# Patient Record
Sex: Male | Born: 2000
Health system: Southern US, Community
[De-identification: ages and names within clinical notes are randomized; demographics above are authoritative.]

## PROBLEM LIST (undated history)

## (undated) HISTORY — PX: LYMPH NODE BIOPSY: SHX201

---
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
## 2023-12-25 ENCOUNTER — Encounter (HOSPITAL_BASED_OUTPATIENT_CLINIC_OR_DEPARTMENT_OTHER): Payer: Self-pay

## 2023-12-25 ENCOUNTER — Emergency Department (HOSPITAL_BASED_OUTPATIENT_CLINIC_OR_DEPARTMENT_OTHER): Admitting: Radiology

## 2023-12-25 ENCOUNTER — Emergency Department (HOSPITAL_BASED_OUTPATIENT_CLINIC_OR_DEPARTMENT_OTHER)
Admission: EM | Admit: 2023-12-25 | Discharge: 2023-12-25 | Disposition: A | Discharge status: Home / Self Care | Attending: Emergency Medicine | Admitting: Emergency Medicine

## 2023-12-25 ENCOUNTER — Other Ambulatory Visit: Payer: Self-pay

## 2023-12-25 DIAGNOSIS — J101 Influenza due to other identified influenza virus with other respiratory manifestations: Secondary | ICD-10-CM | POA: Insufficient documentation

## 2023-12-25 DIAGNOSIS — R059 Cough, unspecified: Secondary | ICD-10-CM | POA: Diagnosis present

## 2023-12-25 DIAGNOSIS — Z79899 Other long term (current) drug therapy: Secondary | ICD-10-CM | POA: Insufficient documentation

## 2023-12-25 LAB — RESP PANEL BY RT-PCR (RSV, FLU A&B, COVID)  RVPGX2
Influenza A by PCR: POSITIVE — AB
Influenza B by PCR: NEGATIVE
Resp Syncytial Virus by PCR: NEGATIVE
SARS Coronavirus 2 by RT PCR: NEGATIVE

## 2023-12-25 NOTE — ED Triage Notes (Addendum)
 Patient arrives POV with complaints of fever, cough, and congestion for 4-5 days. Patient has been seen at Urgent Care and his PCP and tested negative for Covid/Flu & strep. He is currently on antibiotics with minimal improvement. Concerned about his wet cough

## 2023-12-25 NOTE — ED Notes (Signed)
 Pt alert and oriented X 4 at the time of discharge. RR even and unlabored. No acute distress noted. Pt verbalized understanding of discharge instructions as discussed. Pt ambulatory to lobby at time of discharge.

## 2023-12-25 NOTE — Discharge Instructions (Signed)
 You tested positive for the flu today.  It takes about 5-7 days for symptoms to start improving. Your COVID, RSV are negative.  Your chest x-ray does not show any signs of pneumonia.  Your illness is contagious and can be spread to others. It cannot be cured by antibiotics or other medicines. Take basic precautions such as washing your hands often, covering your mouth when you cough or sneeze, and avoiding public places where you could spread your illness to others.  Home care instructions:  You can take Tylenol  (acetominophen) and/or Motrin  (Ibuprofen ) as directed on the packaging for fever reduction and pain relief.    For cough: honey 1/2 to 1 teaspoon (you can dilute the honey in water or another fluid).  You can also use guaifenesin and dextromethorphan for cough which are over-the-counter medications. You can use a humidifier for chest congestion and cough.  If you don't have a humidifier, you can sit in the bathroom with the hot shower running.      For sore throat: try warm salt water gargles, cepacol lozenges, throat spray, warm tea or water with lemon/honey, popsicles or ice, or OTC cold relief medicine for throat discomfort.    For congestion: Flonase 1-2 sprays in each nostril daily.    It is important to stay hydrated: drink plenty of fluids (water, gatorade/powerade/pedialyte, juices, or teas) to keep your throat moisturized and help further relieve irritation/discomfort.   You may return to normal activities (work/school) when:  - You are having improvement in symptoms  - AND have had resolution of fever without the use of fever-reducing medications for 24 hours  Follow-up instructions: Please follow-up with your primary care provider for further evaluation of your symptoms if you are not feeling better within the next 5 days.  Return instructions:  Please return to the Emergency Department if you experience worsening symptoms.  RETURN IMMEDIATELY IF you develop shortness of  breath, confusion or altered mental status, a new rash, become dizzy, faint, or poorly responsive, or are unable to be cared for at home. Please return if you have persistent vomiting and cannot keep down fluids or develop a fever that is not controlled by tylenol  or motrin .   Please return if you have any other emergent concerns.

## 2023-12-25 NOTE — ED Notes (Signed)
Patient back to room from imaging.

## 2023-12-25 NOTE — ED Provider Notes (Signed)
 [place] EMERGENCY DEPARTMENT AT [facility] Provider Note   CSN: [id number] Arrival date & time: 12/25/23  1122     Patient presents with: Cough and Fever   Keith Costa is a 23 y.o. male without significant past medical history presents with concern for a productive cough, congestion, fever, and bodyaches that started 5 days ago.  He reports he was seen by urgent care and his PCP for symptoms but had negative flu and COVID testing there as well as negative strep testing.  He was started on a course of azithromycin for his symptoms and is on the last day of this antibiotic.  He reports his symptoms have not improved significantly with this medication.  Denies any shortness of breath.  Denies any difficulties with swallowing nor sore throat currently.    Cough Associated symptoms: fever   Fever Associated symptoms: cough        Prior to Admission medications   Medication Sig Start Date End Date Taking? Authorizing Provider  methylPREDNISolone  (MEDROL  DOSEPAK) 4 MG TBPK tablet Take pills all together every day for 6 days. 6-5-4-3-2-1. Take the first dose as soon as possible. The rest take in the morning with breakfast. 08/28/20   Ines Onetha NOVAK, MD  rizatriptan  (MAXALT -MLT) 10 MG disintegrating tablet Take 1 tablet (10 mg total) by mouth as needed for migraine. Take at onset of cluster headaches. May repeat in 2 hours if needed 08/28/20   Ines Onetha NOVAK, MD    Allergies: Cefdinir and Omni-pac    Review of Systems  Constitutional:  Positive for fever.  Respiratory:  Positive for cough.     Updated Vital Signs BP 136/81   Pulse 80   Temp 99.2 F (37.3 C) (Oral)   Resp 16   Ht 6' 2 (1.88 m)   Wt 87.1 kg   SpO2 100%   BMI 24.65 kg/m   Physical Exam Vitals and nursing note reviewed.  Constitutional:      General: He is not in acute distress.    Appearance: He is well-developed.  HENT:     Head: Normocephalic and atraumatic.     Right Ear: Tympanic  membrane and ear canal normal.     Left Ear: Tympanic membrane and ear canal normal.     Mouth/Throat:     Pharynx: No oropharyngeal exudate or posterior oropharyngeal erythema.  Eyes:     Conjunctiva/sclera: Conjunctivae normal.  Neck:     Comments: Submandibular lymphadenopathy bilaterally Cardiovascular:     Rate and Rhythm: Normal rate and regular rhythm.     Heart sounds: No murmur heard. Pulmonary:     Effort: Pulmonary effort is normal. No respiratory distress.     Breath sounds: Normal breath sounds.  Abdominal:     Palpations: Abdomen is soft.     Tenderness: There is no abdominal tenderness.  Musculoskeletal:        General: No swelling.     Cervical back: Neck supple.  Skin:    General: Skin is warm and dry.     Capillary Refill: Capillary refill takes less than 2 seconds.  Neurological:     Mental Status: He is alert.  Psychiatric:        Mood and Affect: Mood normal.     (all labs ordered are listed, but only abnormal results are displayed) Labs Reviewed  RESP PANEL BY RT-PCR (RSV, FLU A&B, COVID)  [id number] - Abnormal; Notable for the following components:      Result Value  Influenza A by PCR POSITIVE (*)    All other components within normal limits    EKG: None  Radiology: DG Chest 2 View Result Date: 12/25/2023 EXAM: 2 VIEW(S) XRAY OF THE CHEST 12/25/2023 12:27:19 PM COMPARISON: 04/26/2010. CLINICAL HISTORY: cough FINDINGS: LUNGS AND PLEURA: No focal pulmonary opacity. No pleural effusion. No pneumothorax. HEART AND MEDIASTINUM: No acute abnormality of the cardiac and mediastinal silhouettes. BONES AND SOFT TISSUES: No acute osseous abnormality. IMPRESSION: 1. No acute process. Electronically signed by: Waddell Calk MD 12/25/2023 12:40 PM EST RP Workstation: [id number]     Procedures   Medications Ordered in the ED - No data to display                                  Medical Decision Making Amount and/or Complexity of Data Reviewed Radiology:  ordered.     Differential diagnosis includes but is not limited to COVID, flu, RSV, viral URI, strep pharyngitis, viral pharyngitis, allergic rhinitis, pneumonia, bronchitis   ED Course:  Upon initial evaluation, patient is well-appearing, no acute distress.  Lungs good auscultation bilaterally.  Regular rate and rhythm on cardiac auscultation.  Posterior pharynx without significant erythema, no tonsillar exudate.  TMs without signs of otitis media or otitis externa.  Labs Ordered: I Ordered, and personally interpreted labs.  The pertinent results include:   Influenza A positive. Covid and RSV negative   Imaging Studies ordered: I ordered imaging studies including chest x-ray  I independently visualized the imaging with scope of interpretation limited to determining acute life threatening conditions related to emergency care. Imaging showed no acute abnormalities I agree with the radiologist interpretation  Medications Given: None  Upon re-evaluation, patient remains well appearing. Patient tested positive for Influenza A which would explain patient's symptoms. Covid and RSV negative. Chest x-ray without signs of pneumonia. Patient without significant co-morbidities, no significant vital sign abnormalities, tolerating PO intake, do not feel patient needs any admission for treatment or observation.  Patient outside the window of antiviral therapy.  Patient stable and appropriate for discharge home at this time.     Impression: Influenza A  Disposition:  Discharged home with instructions to use over-the-counter medications as needed for symptom control.  Follow-up with PCP if symptoms not improving within the next 5 days. Remain out of school/work until symptoms improving and fever free for at least 24 hours without the use of fever reducing medication.  Work/school note provided. Return precautions given and patient verbalized understanding.    This chart was dictated using voice  recognition software, Dragon. Despite the best efforts of this provider to proofread and correct errors, errors may still occur which can change documentation meaning.       Final diagnoses:  Influenza A    ED Discharge Orders     None          Veta Palma, [place] 12/25/23 1432    Tegeler, Lonni PARAS, MD 12/25/23 1537
